# Patient Record
Sex: Female | Born: 1965
Health system: Southern US, Community
[De-identification: ages and names within clinical notes are randomized; demographics above are authoritative.]

## PROBLEM LIST (undated history)

## (undated) DIAGNOSIS — N809 Endometriosis, unspecified: Secondary | ICD-10-CM

## (undated) DIAGNOSIS — I10 Essential (primary) hypertension: Secondary | ICD-10-CM

## (undated) DIAGNOSIS — E785 Hyperlipidemia, unspecified: Secondary | ICD-10-CM

## (undated) DIAGNOSIS — J45909 Unspecified asthma, uncomplicated: Secondary | ICD-10-CM

## (undated) HISTORY — PX: HERNIA REPAIR: SHX51

---

## 2017-04-24 ENCOUNTER — Emergency Department (HOSPITAL_BASED_OUTPATIENT_CLINIC_OR_DEPARTMENT_OTHER): Payer: 59

## 2017-04-24 ENCOUNTER — Emergency Department (HOSPITAL_BASED_OUTPATIENT_CLINIC_OR_DEPARTMENT_OTHER)
Admission: EM | Admit: 2017-04-24 | Discharge: 2017-04-24 | Disposition: A | Discharge status: Home / Self Care | Payer: 59 | Attending: Emergency Medicine | Admitting: Emergency Medicine

## 2017-04-24 ENCOUNTER — Encounter (HOSPITAL_BASED_OUTPATIENT_CLINIC_OR_DEPARTMENT_OTHER): Payer: Self-pay | Admitting: *Deleted

## 2017-04-24 ENCOUNTER — Other Ambulatory Visit: Payer: Self-pay

## 2017-04-24 DIAGNOSIS — I10 Essential (primary) hypertension: Secondary | ICD-10-CM | POA: Insufficient documentation

## 2017-04-24 DIAGNOSIS — R072 Precordial pain: Secondary | ICD-10-CM | POA: Insufficient documentation

## 2017-04-24 DIAGNOSIS — J45909 Unspecified asthma, uncomplicated: Secondary | ICD-10-CM | POA: Diagnosis not present

## 2017-04-24 DIAGNOSIS — R000 Tachycardia, unspecified: Secondary | ICD-10-CM | POA: Insufficient documentation

## 2017-04-24 HISTORY — DX: Essential (primary) hypertension: I10

## 2017-04-24 HISTORY — DX: Endometriosis, unspecified: N80.9

## 2017-04-24 HISTORY — DX: Unspecified asthma, uncomplicated: J45.909

## 2017-04-24 HISTORY — DX: Hyperlipidemia, unspecified: E78.5

## 2017-04-24 LAB — BASIC METABOLIC PANEL
Anion gap: 11 (ref 5–15)
BUN: 10 mg/dL (ref 6–20)
CHLORIDE: 105 mmol/L (ref 101–111)
CO2: 20 mmol/L — AB (ref 22–32)
Calcium: 9.3 mg/dL (ref 8.9–10.3)
Creatinine, Ser: 0.95 mg/dL (ref 0.44–1.00)
GFR calc non Af Amer: >60 mL/min (ref >60)
Glucose, Bld: 92 mg/dL (ref 65–99)
Potassium: 3.7 mmol/L (ref 3.5–5.1)
Sodium: 136 mmol/L (ref 135–145)

## 2017-04-24 LAB — CBC WITH DIFFERENTIAL/PLATELET
Basophils Absolute: 0 10*3/uL (ref 0.0–0.1)
Basophils Relative: 0 %
Eosinophils Absolute: 0.1 10*3/uL (ref 0.0–0.7)
Eosinophils Relative: 2 %
HEMATOCRIT: 41.6 % (ref 36.0–46.0)
HEMOGLOBIN: 13.7 g/dL (ref 12.0–15.0)
LYMPHS ABS: 1 10*3/uL (ref 0.7–4.0)
Lymphocytes Relative: 12 %
MCH: 30.2 pg (ref 26.0–34.0)
MCHC: 32.9 g/dL (ref 30.0–36.0)
MCV: 91.6 fL (ref 78.0–100.0)
Monocytes Absolute: 0.5 10*3/uL (ref 0.1–1.0)
Monocytes Relative: 6 %
NEUTROS ABS: 6.2 10*3/uL (ref 1.7–7.7)
NEUTROS PCT: 80 %
Platelets: 402 10*3/uL — ABNORMAL HIGH (ref 150–400)
RBC: 4.54 MIL/uL (ref 3.87–5.11)
RDW: 12.8 % (ref 11.5–15.5)
WBC: 7.7 10*3/uL (ref 4.0–10.5)

## 2017-04-24 LAB — D-DIMER, QUANTITATIVE: D-Dimer, Quant: 0.56 ug/mL-FEU — ABNORMAL HIGH (ref 0.00–0.50)

## 2017-04-24 LAB — TROPONIN I: Troponin I: <0.03 ng/mL (ref <0.03)

## 2017-04-24 MED ORDER — IBUPROFEN 800 MG PO TABS: 800.0000 mg | ORAL_TABLET | Freq: Three times a day (TID) | ORAL | 0 refills | Status: AC | PRN

## 2017-04-24 MED ORDER — BENZONATATE 100 MG PO CAPS: 100.0000 mg | ORAL_CAPSULE | Freq: Three times a day (TID) | ORAL | 0 refills | Status: AC

## 2017-04-24 MED ORDER — IOPAMIDOL (ISOVUE-370) INJECTION 76%
100.0000 mL | Freq: Once | INTRAVENOUS | Status: AC | PRN
  Administered 2017-04-24: 100 mL via INTRAVENOUS

## 2017-04-24 MED ORDER — FLUTICASONE PROPIONATE 50 MCG/ACT NA SUSP: 2.0000 | Freq: Every day | NASAL | 0 refills | Status: AC

## 2017-04-24 MED FILL — FLUTICASONE PROP 50 MCG SPR: 50 | 30 days supply | Qty: 16 | Fill #0

## 2017-04-24 MED FILL — BENZONATATE 100 MG CAPSULE: 100 | 7 days supply | Qty: 21 | Fill #0

## 2017-04-24 MED FILL — IBUPROFEN 800 MG TAB: 800 | 7 days supply | Qty: 21 | Fill #0

## 2017-04-24 NOTE — ED Triage Notes (Addendum)
Pt c/o URi symptoms  x 3 days today c/o mid sternal chest pain with deep breathing Sudafed PTA

## 2017-04-24 NOTE — Discharge Instructions (Signed)

## 2017-04-24 NOTE — ED Provider Notes (Signed)
Emergency Department Provider Note   I have reviewed the triage vital signs and the nursing notes.   HISTORY  Chief Complaint URI   HPI Elizabeth Reilly is a 52 y.o. female with PMH of HLD, HTN, and asthma to the emergency department for evaluation of cold symptoms for the past 3 days with new onset central chest discomfort.  Patient states it is worse with deep breathing and with movement. Worse with laying flat. She has NOT had severe coughing.  She states her URI symptoms are primarily runny nose congestion.  No hemoptysis.  No history of blood clots in the legs or lungs.  No recent surgeries or prolonged inactivity.  Denies fevers or shaking chills.  No sick contacts.   Past Medical History:  Diagnosis Date  . Asthma   . Endometriosis   . Hyperlipidemia   . Hypertension     There are no active problems to display for this patient.   Past Surgical History:  Procedure Laterality Date  . HERNIA REPAIR      Current Outpatient Rx  . Order #: [id number] Class: Historical Med  . Order #: [id number] Class: Historical Med  . Order #: [id number] Class: Historical Med  . Order #: [id number] Class: Historical Med  . Order #: [id number] Class: Historical Med  . Order #: [id number] Class: Print  . Order #: [id number] Class: Print  . Order #: [id number] Class: Print    Allergies Patient has no known allergies.  History reviewed. No pertinent family history.  Social History Social History   Tobacco Use  . Smoking status: Never Smoker  . Smokeless tobacco: Never Used  Substance Use Topics  . Alcohol use: No    Frequency: Never  . Drug use: No    Review of Systems  Constitutional: No fever/chills Eyes: No visual changes. ENT: No sore throat. Positive nasal congestion and cough.  Cardiovascular: Positive chest pain. Respiratory: Denies shortness of breath. Gastrointestinal: No abdominal pain.  No nausea, no vomiting.  No diarrhea.  No constipation. Genitourinary: Negative for  dysuria. Musculoskeletal: Negative for back pain. Skin: Negative for rash. Neurological: Negative for headaches, focal weakness or numbness.  10-point ROS otherwise negative.  ____________________________________________   PHYSICAL EXAM:  VITAL SIGNS: ED Triage Vitals  Enc Vitals Group     BP 04/24/17 1302 (!) 172/85     Pulse Rate 04/24/17 1302 (!) 115     Resp 04/24/17 1302 16     Temp 04/24/17 1302 98.5 F (36.9 C)     Temp src --      SpO2 04/24/17 1302 100 %     Weight 04/24/17 1257 160 lb (72.6 kg)     Height 04/24/17 1257 5\' 2"  (1.575 m)     Pain Score 04/24/17 1257 6    Constitutional: Alert and oriented. Well appearing and in no acute distress. Eyes: Conjunctivae are normal.  Head: Atraumatic. Nose: No congestion/rhinnorhea. Mouth/Throat: Mucous membranes are moist. Neck: No stridor.   Cardiovascular: Tachycardia. Good peripheral circulation. Grossly normal heart sounds.   Respiratory: Normal respiratory effort.  No retractions. Lungs CTAB. Gastrointestinal: Soft and nontender. No distention.  Musculoskeletal: No lower extremity tenderness nor edema. No gross deformities of extremities. No significant reproducible tenderness to palpation over the anterior chest wall.  Neurologic:  Normal speech and language. No gross focal neurologic deficits are appreciated.  Skin:  Skin is warm, dry and intact. No rash noted.   ____________________________________________   LABS (all labs ordered are listed, but only abnormal results are displayed)  Labs Reviewed  D-DIMER, QUANTITATIVE (NOT AT [facility]) - Abnormal; Notable for the following components:      Result Value   D-Dimer, Quant 0.56 (*)    All other components within normal limits  BASIC METABOLIC PANEL - Abnormal; Notable for the following components:   CO2 20 (*)    All other components within normal limits  CBC WITH DIFFERENTIAL/PLATELET - Abnormal; Notable for the following components:   Platelets 402 (*)     All other components within normal limits  TROPONIN I   ____________________________________________  EKG   EKG Interpretation  Date/Time:  Friday April 24 2017 14:21:49 EST Ventricular Rate:  106 PR Interval:  136 QRS Duration: 76 QT Interval:  340 QTC Calculation: 451 R Axis:   91 Text Interpretation:  Sinus tachycardia Rightward axis Abnormal ECG No STEMI.  Confirmed by Alona Bene [phone]) on 04/24/2017 2:38:26 PM       ____________________________________________  RADIOLOGY  Dg Chest 2 View  Result Date: 04/24/2017 CLINICAL DATA:  Cough, shortness of breath EXAM: CHEST  2 VIEW COMPARISON:  None. FINDINGS: The heart size and mediastinal contours are within normal limits. Both lungs are clear. The visualized skeletal structures are unremarkable. IMPRESSION: No active cardiopulmonary disease. Electronically Signed   By: Elige Ko   On: 04/24/2017 13:26   Ct Angio Chest Pe W And/or Wo Contrast  Result Date: 04/24/2017 CLINICAL DATA:  Tingling down both arms.  Pain. EXAM: CT ANGIOGRAPHY CHEST WITH CONTRAST TECHNIQUE: Multidetector CT imaging of the chest was performed using the standard protocol during bolus administration of intravenous contrast. Multiplanar CT image reconstructions and MIPs were obtained to evaluate the vascular anatomy. CONTRAST:  ISOVUE-370 IOPAMIDOL (ISOVUE-370) INJECTION 76% COMPARISON:  None. FINDINGS: Cardiovascular: Satisfactory opacification of the pulmonary arteries to the segmental level. No evidence of pulmonary embolism. Normal heart size. No pericardial effusion. 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 Mediastinum/Nodes: No enlarged mediastinal, hilar, or axillary lymph nodes. Thyroid gland, trachea, and esophagus demonstrate no significant findings. Lungs/Pleura: Lungs are clear. No pleural effusion or pneumothorax. Upper Abdomen: No acute abnormality. Musculoskeletal: No chest wall abnormality. No acute or significant  osseous findings. Review of the MIP images confirms the above findings. IMPRESSION: 1. No evidence of pulmonary embolus. 2. No acute cardiopulmonary disease. Electronically Signed   By: Elige Ko   On: 04/24/2017 15:59    ____________________________________________   PROCEDURES  Procedure(s) performed:   Procedures  None ____________________________________________   INITIAL IMPRESSION / ASSESSMENT AND PLAN / ED COURSE  Pertinent labs & imaging results that were available during my care of the patient were reviewed by me and considered in my medical decision making (see chart for details).  Patient presents to the ED with pleuritic chest pain and tachycardia in the setting of URI. No evidence on EKG to suggest pericarditis. No significant reproduction on pain on exam. Low pre-test prob of PE but unable to [facility] with tachycardia and OCP use. D-dimer slightly elevated. Patient had CTA which was negative. Re-evaluated the patient and advised Motrin, Flonase, and Tessalon. Discussed return precautions in detail.   At this time, I do not feel there is any life-threatening condition present. I have reviewed and discussed all results (EKG, imaging, lab, urine as appropriate), exam findings with patient. I have reviewed nursing notes and appropriate previous records.  I feel the patient is safe to be discharged home without further emergent workup. Discussed usual and customary return precautions. Patient and family (if present) verbalize understanding and are comfortable  with this plan.  Patient will follow-up with their primary care provider. If they do not have a primary care provider, information for follow-up has been provided to them. All questions have been answered.  ____________________________________________  FINAL CLINICAL IMPRESSION(S) / ED DIAGNOSES  Final diagnoses:  Precordial chest pain  Tachycardia     MEDICATIONS GIVEN DURING THIS VISIT:  Medications  iopamidol  (ISOVUE-370) 76 % injection 100 mL (100 mLs Intravenous Contrast Given 04/24/17 1546)     NEW OUTPATIENT MEDICATIONS STARTED DURING THIS VISIT:  Motrin, Tessalon, and Flonase   Note:  This document was prepared using Dragon voice recognition software and may include unintentional dictation errors.  Alona BeneJoshua Gilberto Streck, MD Emergency Medicine    Gaelan Glennon, Arlyss RepressJoshua G, MD 04/24/17 [phone]

## 2017-04-24 NOTE — ED Notes (Signed)
Pt in CT.

## 2017-04-24 NOTE — ED Notes (Signed)
Patient transported to X-ray 

## 2018-12-06 IMAGING — DX DG CHEST 2V
2 series · 2 of 2 positions shown · non-contrast
Comparison: None.

CLINICAL DATA: Cough, shortness of breath

EXAM:
CHEST  2 VIEW

[chest pa]
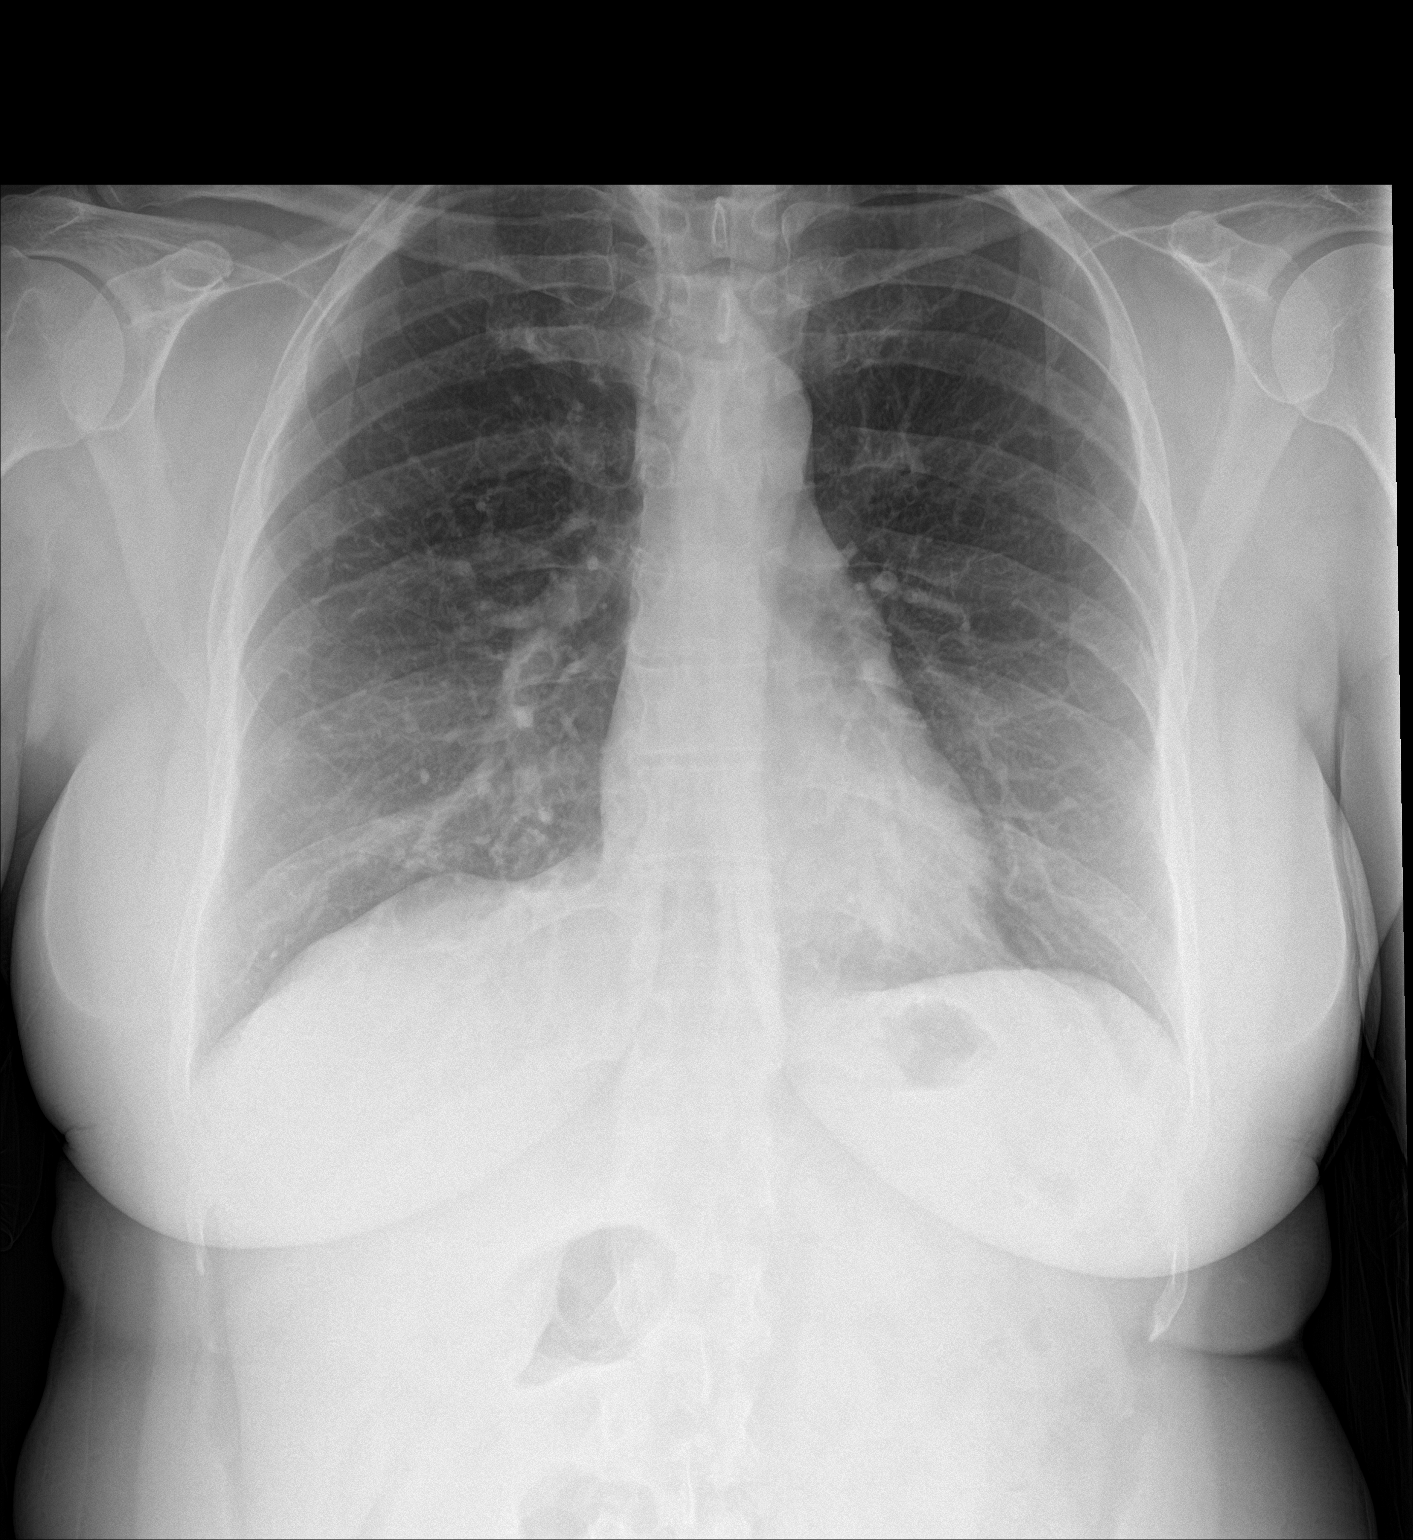

[chest lat]
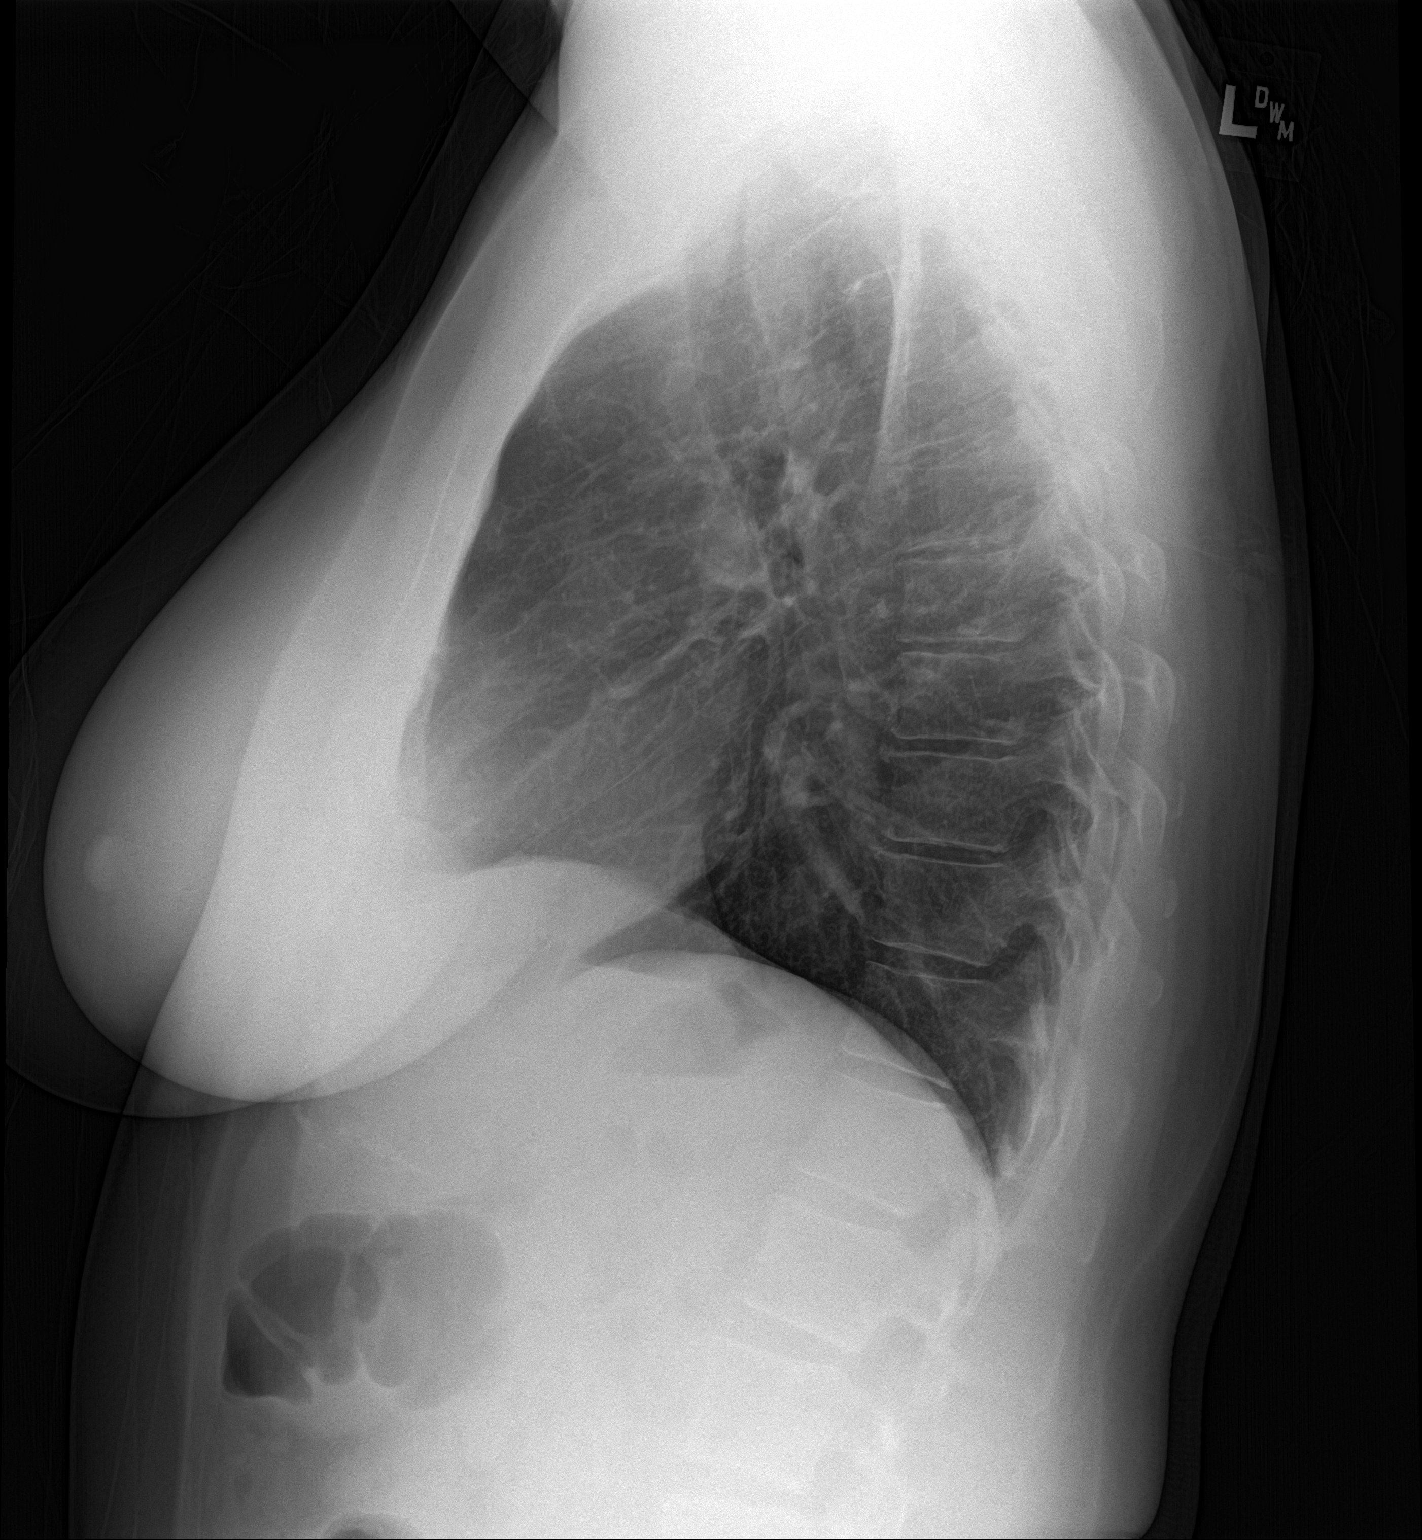

[2 of 2 positions shown; findings below may reference images not displayed]

FINDINGS: The heart size and mediastinal contours are within normal limits.
Both lungs are clear. The visualized skeletal structures are
unremarkable.
IMPRESSION: No active cardiopulmonary disease.

## 2018-12-06 IMAGING — CT CT ANGIO CHEST
2 of 10 series · 16 of 36 positions shown · IV contrast (iopamidol)
Comparison: None.

CLINICAL DATA: Tingling down both arms.  Pain.

EXAM:
CT ANGIOGRAPHY CHEST WITH CONTRAST
TECHNIQUE: Multidetector CT imaging of the chest was performed using the
standard protocol during bolus administration of intravenous
contrast. Multiplanar CT image reconstructions and MIPs were
obtained to evaluate the vascular anatomy.
CONTRAST:  100mL [id number] IOPAMIDOL (46UD85-CD0) INJECTION 76%

[Series 6: pe thins · axial · 0.84mm/px · z∈[-296,-14]mm · 15 of 324 slices shown]
[im 21/324  lung]
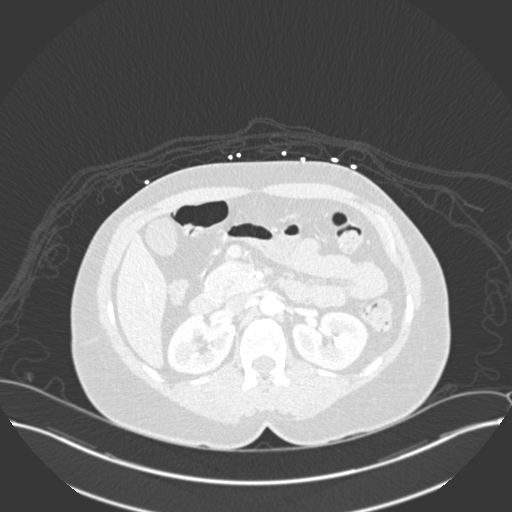
[im 41/324  mediastinal]
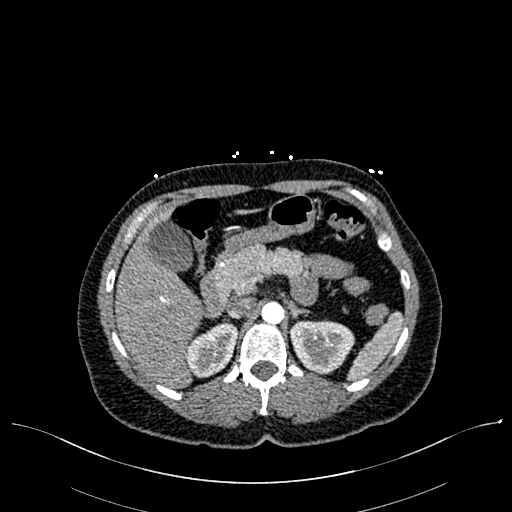
[im 61/324  lung]
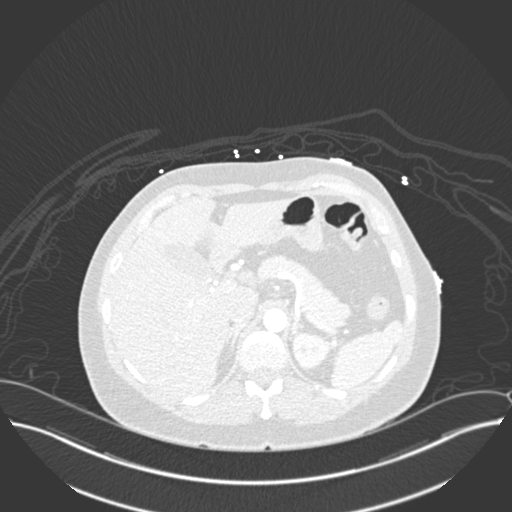
[im 81/324  mediastinal]
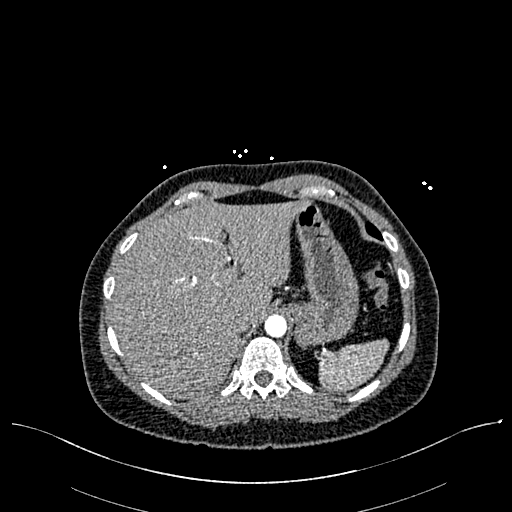
[im 101/324  lung]
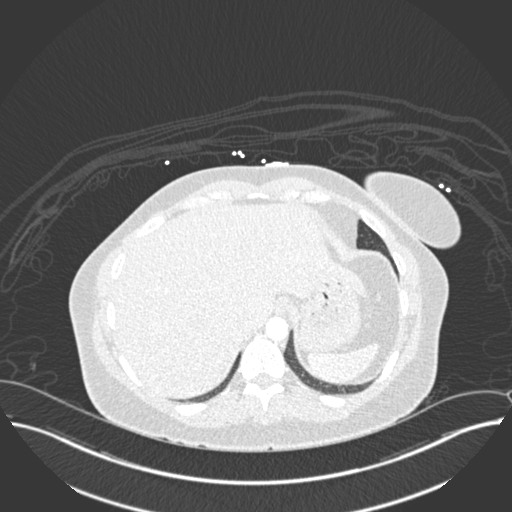
[im 122/324  mediastinal]
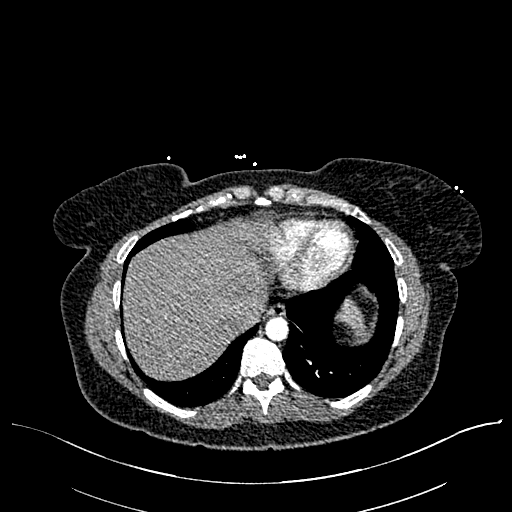
[im 142/324  lung]
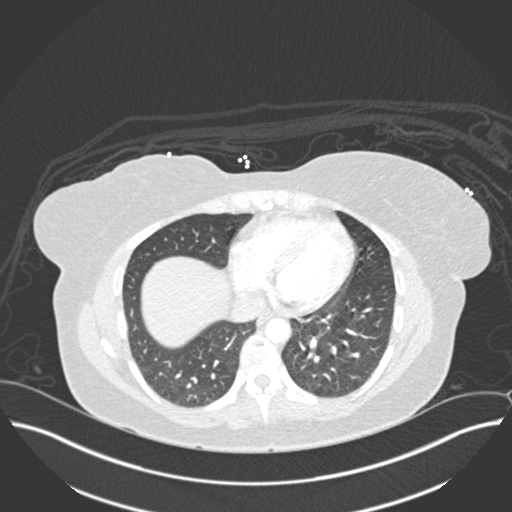
[im 162/324  mediastinal]
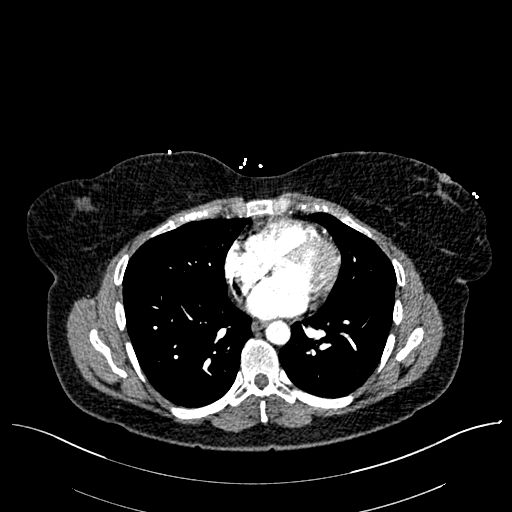
[im 182/324  lung]
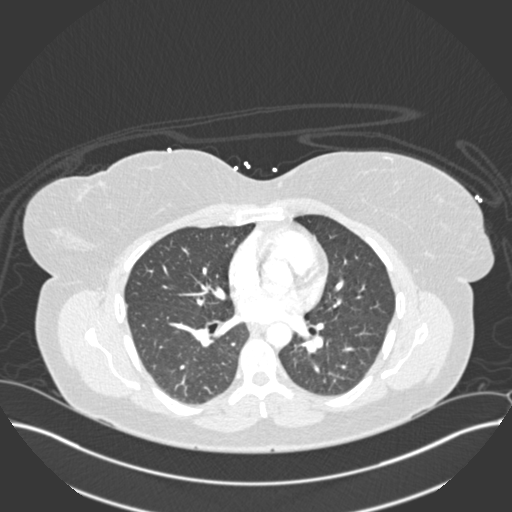
[im 202/324  mediastinal]
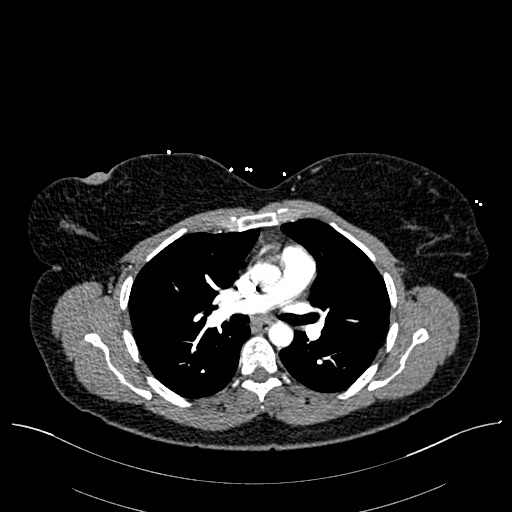
[im 223/324  lung]
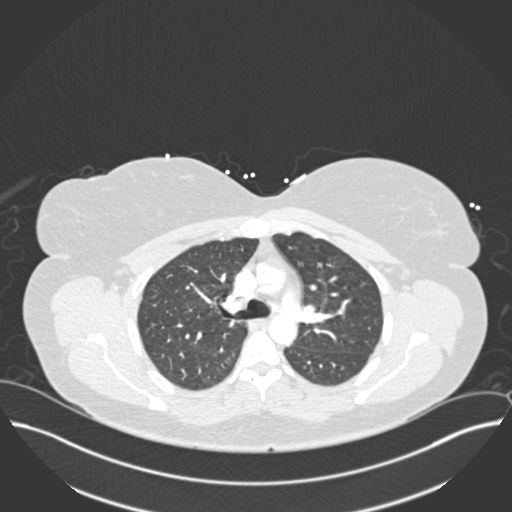
[im 243/324  mediastinal]
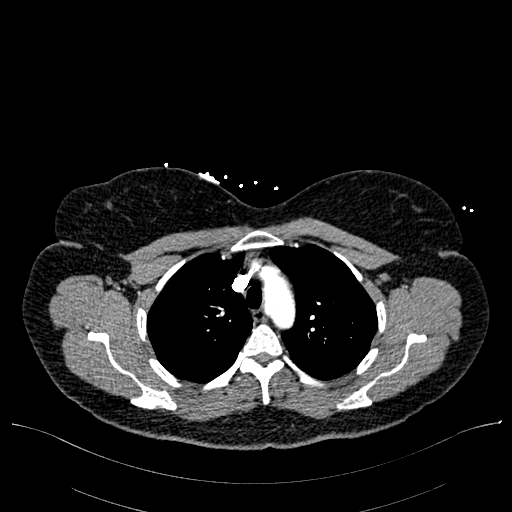
[im 263/324  lung]
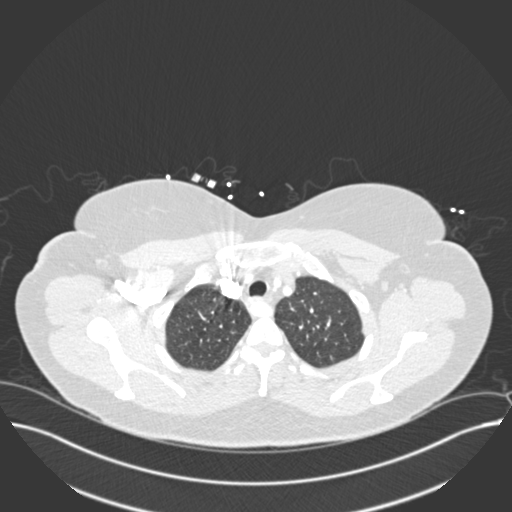
[im 283/324  mediastinal]
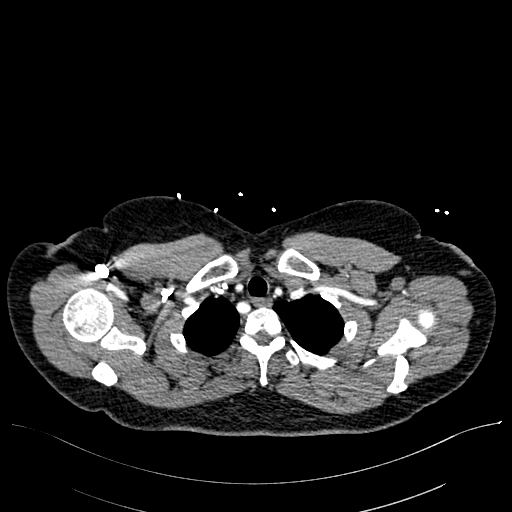
[im 303/324  lung]
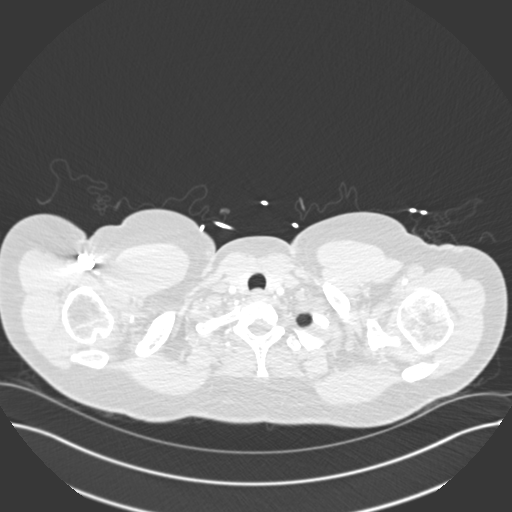

[Series 8: pe coronal mpr · coronal · 0.64mm/px · 1 of 131 slices shown]
[im 66/131  mediastinal]
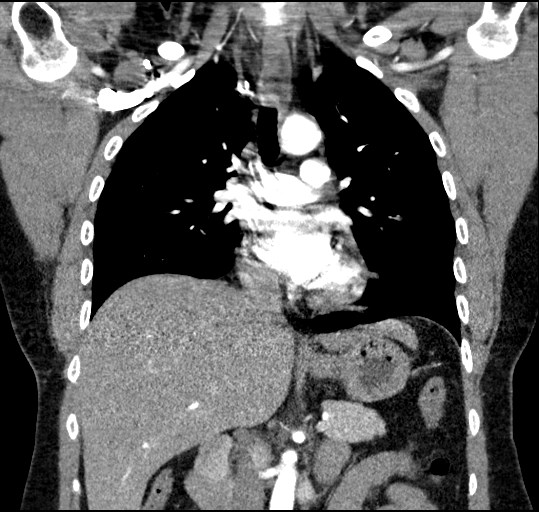

[16 of 36 positions shown; findings below may reference images not displayed]

FINDINGS: Cardiovascular: Satisfactory opacification of the pulmonary arteries
to the segmental level. No evidence of pulmonary embolism. Normal
heart size. No pericardial effusion. Aberrant right subclavian
artery with a retroesophageal course.

Mediastinum/Nodes: No enlarged mediastinal, hilar, or axillary lymph
nodes. Thyroid gland, trachea, and esophagus demonstrate no
significant findings.

Lungs/Pleura: Lungs are clear. No pleural effusion or pneumothorax.

Upper Abdomen: No acute abnormality.

Musculoskeletal: No chest wall abnormality. No acute or significant
osseous findings.

Review of the MIP images confirms the above findings.
IMPRESSION: 1. No evidence of pulmonary embolus.
2. No acute cardiopulmonary disease.
